# Patient Record
Sex: Female | Born: 1960
Health system: Southern US, Community
[De-identification: ages and names within clinical notes are randomized; demographics above are authoritative.]

## PROBLEM LIST (undated history)

## (undated) DIAGNOSIS — R51 Headache: Secondary | ICD-10-CM

## (undated) DIAGNOSIS — M542 Cervicalgia: Secondary | ICD-10-CM

## (undated) DIAGNOSIS — R519 Headache, unspecified: Secondary | ICD-10-CM

## (undated) DIAGNOSIS — M722 Plantar fascial fibromatosis: Secondary | ICD-10-CM

## (undated) HISTORY — DX: Headache, unspecified: R51.9

## (undated) HISTORY — DX: Cervicalgia: M54.2

## (undated) HISTORY — PX: BREAST BIOPSY: SHX20

## (undated) HISTORY — PX: BIOPSY BREAST: PRO8

## (undated) HISTORY — DX: Plantar fascial fibromatosis: M72.2

## (undated) HISTORY — DX: Headache: R51

---

## 1997-11-26 ENCOUNTER — Other Ambulatory Visit: Admission: RE | Admit: 1997-11-26 | Discharge: 1997-11-26 | Payer: Self-pay | Admitting: Obstetrics and Gynecology

## 1998-05-20 ENCOUNTER — Encounter: Payer: Self-pay | Admitting: Obstetrics & Gynecology

## 1998-05-20 ENCOUNTER — Ambulatory Visit (HOSPITAL_COMMUNITY): Admission: RE | Admit: 1998-05-20 | Discharge: 1998-05-20 | Payer: Self-pay | Admitting: Obstetrics & Gynecology

## 1998-06-18 ENCOUNTER — Inpatient Hospital Stay (HOSPITAL_COMMUNITY): Admission: AD | Admit: 1998-06-18 | Discharge: 1998-06-18 | Payer: Self-pay | Admitting: Obstetrics and Gynecology

## 1998-06-19 ENCOUNTER — Inpatient Hospital Stay (HOSPITAL_COMMUNITY): Admission: AD | Admit: 1998-06-19 | Discharge: 1998-06-22 | Payer: Self-pay | Admitting: Obstetrics and Gynecology

## 1999-11-18 ENCOUNTER — Encounter: Admission: RE | Admit: 1999-11-18 | Discharge: 1999-11-18 | Payer: Self-pay | Admitting: Obstetrics and Gynecology

## 1999-11-18 ENCOUNTER — Encounter: Payer: Self-pay | Admitting: Obstetrics and Gynecology

## 2000-11-05 ENCOUNTER — Ambulatory Visit (HOSPITAL_COMMUNITY): Admission: RE | Admit: 2000-11-05 | Discharge: 2000-11-05 | Payer: Self-pay | Admitting: Obstetrics and Gynecology

## 2000-11-05 ENCOUNTER — Encounter: Payer: Self-pay | Admitting: Obstetrics and Gynecology

## 2001-01-11 ENCOUNTER — Other Ambulatory Visit: Admission: RE | Admit: 2001-01-11 | Discharge: 2001-01-11 | Payer: Self-pay | Admitting: Obstetrics and Gynecology

## 2003-01-05 ENCOUNTER — Encounter: Payer: Self-pay | Admitting: Obstetrics and Gynecology

## 2003-01-05 ENCOUNTER — Encounter: Admission: RE | Admit: 2003-01-05 | Discharge: 2003-01-05 | Payer: Self-pay | Admitting: Obstetrics and Gynecology

## 2003-01-20 ENCOUNTER — Other Ambulatory Visit: Admission: RE | Admit: 2003-01-20 | Discharge: 2003-01-20 | Payer: Self-pay | Admitting: Obstetrics and Gynecology

## 2004-01-28 ENCOUNTER — Other Ambulatory Visit: Admission: RE | Admit: 2004-01-28 | Discharge: 2004-01-28 | Payer: Self-pay | Admitting: Obstetrics and Gynecology

## 2005-01-31 ENCOUNTER — Other Ambulatory Visit: Admission: RE | Admit: 2005-01-31 | Discharge: 2005-01-31 | Payer: Self-pay | Admitting: Obstetrics and Gynecology

## 2005-02-23 ENCOUNTER — Encounter: Admission: RE | Admit: 2005-02-23 | Discharge: 2005-02-23 | Payer: Self-pay | Admitting: Obstetrics and Gynecology

## 2006-02-01 ENCOUNTER — Other Ambulatory Visit: Admission: RE | Admit: 2006-02-01 | Discharge: 2006-02-01 | Payer: Self-pay | Admitting: Obstetrics and Gynecology

## 2006-03-30 ENCOUNTER — Encounter: Admission: RE | Admit: 2006-03-30 | Discharge: 2006-03-30 | Payer: Self-pay | Admitting: Family Medicine

## 2007-02-21 ENCOUNTER — Other Ambulatory Visit: Admission: RE | Admit: 2007-02-21 | Discharge: 2007-02-21 | Payer: Self-pay | Admitting: Obstetrics and Gynecology

## 2007-05-10 ENCOUNTER — Encounter: Admission: RE | Admit: 2007-05-10 | Discharge: 2007-05-10 | Payer: Self-pay | Admitting: Obstetrics and Gynecology

## 2008-02-24 ENCOUNTER — Other Ambulatory Visit: Admission: RE | Admit: 2008-02-24 | Discharge: 2008-02-24 | Payer: Self-pay | Admitting: Obstetrics and Gynecology

## 2008-03-03 ENCOUNTER — Encounter: Admission: RE | Admit: 2008-03-03 | Discharge: 2008-03-03 | Payer: Self-pay | Admitting: Obstetrics and Gynecology

## 2008-04-28 ENCOUNTER — Emergency Department (HOSPITAL_COMMUNITY): Admission: EM | Admit: 2008-04-28 | Discharge: 2008-04-28 | Payer: Self-pay | Admitting: Emergency Medicine

## 2008-05-15 ENCOUNTER — Encounter: Admission: RE | Admit: 2008-05-15 | Discharge: 2008-05-15 | Payer: Self-pay | Admitting: Obstetrics and Gynecology

## 2008-05-27 ENCOUNTER — Ambulatory Visit (HOSPITAL_COMMUNITY): Admission: RE | Admit: 2008-05-27 | Discharge: 2008-05-27 | Payer: Self-pay | Admitting: Obstetrics and Gynecology

## 2009-03-16 ENCOUNTER — Other Ambulatory Visit: Admission: RE | Admit: 2009-03-16 | Discharge: 2009-03-16 | Payer: Self-pay | Admitting: Obstetrics and Gynecology

## 2009-05-17 ENCOUNTER — Encounter: Admission: RE | Admit: 2009-05-17 | Discharge: 2009-05-17 | Payer: Self-pay | Admitting: Obstetrics and Gynecology

## 2010-05-02 ENCOUNTER — Other Ambulatory Visit: Admission: RE | Admit: 2010-05-02 | Discharge: 2010-05-02 | Payer: Self-pay | Admitting: Family Medicine

## 2010-05-20 ENCOUNTER — Encounter: Admission: RE | Admit: 2010-05-20 | Discharge: 2010-05-20 | Payer: Self-pay | Admitting: Family Medicine
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
## 2010-07-27 ENCOUNTER — Ambulatory Visit (HOSPITAL_COMMUNITY)
Admission: RE | Admit: 2010-07-27 | Discharge: 2010-07-27 | Payer: Self-pay | Source: Home / Self Care | Attending: Orthopedic Surgery | Admitting: Orthopedic Surgery

## 2010-08-14 ENCOUNTER — Encounter: Payer: Self-pay | Admitting: Family Medicine

## 2010-08-22 ENCOUNTER — Ambulatory Visit (HOSPITAL_COMMUNITY)
Admission: RE | Admit: 2010-08-22 | Discharge: 2010-08-22 | Payer: Self-pay | Source: Home / Self Care | Attending: Family Medicine | Admitting: Family Medicine

## 2011-04-25 ENCOUNTER — Other Ambulatory Visit: Payer: Self-pay | Admitting: Family Medicine

## 2011-04-25 DIAGNOSIS — Z1231 Encounter for screening mammogram for malignant neoplasm of breast: Secondary | ICD-10-CM

## 2011-05-22 ENCOUNTER — Ambulatory Visit
Admission: RE | Admit: 2011-05-22 | Discharge: 2011-05-22 | Disposition: A | Discharge status: Home / Self Care | Payer: 59 | Source: Ambulatory Visit | Attending: Family Medicine | Admitting: Family Medicine

## 2011-05-22 DIAGNOSIS — Z1231 Encounter for screening mammogram for malignant neoplasm of breast: Secondary | ICD-10-CM

## 2012-05-15 ENCOUNTER — Ambulatory Visit (HOSPITAL_COMMUNITY)
Admission: RE | Admit: 2012-05-15 | Discharge: 2012-05-15 | Disposition: A | Discharge status: Home / Self Care | Payer: 59 | Source: Ambulatory Visit | Attending: Family Medicine | Admitting: Family Medicine

## 2012-05-15 DIAGNOSIS — M79609 Pain in unspecified limb: Secondary | ICD-10-CM

## 2012-05-15 DIAGNOSIS — M7989 Other specified soft tissue disorders: Secondary | ICD-10-CM | POA: Insufficient documentation

## 2012-05-15 DIAGNOSIS — M79606 Pain in leg, unspecified: Secondary | ICD-10-CM

## 2012-05-15 NOTE — Progress Notes (Signed)
Left:  No evidence of DVT, superficial thrombosis, or Baker's cyst.  Right:  Negative for DVT in the common femoral vein.  

## 2012-05-21 ENCOUNTER — Other Ambulatory Visit (HOSPITAL_COMMUNITY): Payer: Self-pay | Admitting: Family Medicine

## 2012-05-21 DIAGNOSIS — Z780 Asymptomatic menopausal state: Secondary | ICD-10-CM

## 2012-05-23 ENCOUNTER — Other Ambulatory Visit: Payer: Self-pay | Admitting: Family Medicine

## 2012-05-23 DIAGNOSIS — Z1231 Encounter for screening mammogram for malignant neoplasm of breast: Secondary | ICD-10-CM

## 2012-05-28 ENCOUNTER — Ambulatory Visit
Admission: RE | Admit: 2012-05-28 | Discharge: 2012-05-28 | Disposition: A | Discharge status: Home / Self Care | Payer: 59 | Source: Ambulatory Visit | Attending: Family Medicine | Admitting: Family Medicine

## 2012-05-28 DIAGNOSIS — Z1231 Encounter for screening mammogram for malignant neoplasm of breast: Secondary | ICD-10-CM

## 2012-05-29 ENCOUNTER — Ambulatory Visit (HOSPITAL_COMMUNITY): Admission: RE | Admit: 2012-05-29 | Payer: 59 | Source: Ambulatory Visit

## 2012-06-03 ENCOUNTER — Ambulatory Visit (HOSPITAL_COMMUNITY)
Admission: RE | Admit: 2012-06-03 | Discharge: 2012-06-03 | Disposition: A | Discharge status: Home / Self Care | Payer: 59 | Source: Ambulatory Visit | Attending: Family Medicine | Admitting: Family Medicine

## 2012-06-03 DIAGNOSIS — Z780 Asymptomatic menopausal state: Secondary | ICD-10-CM | POA: Insufficient documentation

## 2012-06-03 DIAGNOSIS — Z13820 Encounter for screening for osteoporosis: Secondary | ICD-10-CM | POA: Insufficient documentation

## 2012-12-23 ENCOUNTER — Emergency Department (HOSPITAL_COMMUNITY)
Admission: EM | Admit: 2012-12-23 | Discharge: 2012-12-23 | Disposition: A | Discharge status: Home / Self Care | Payer: 59 | Attending: Emergency Medicine | Admitting: Emergency Medicine

## 2012-12-23 ENCOUNTER — Encounter (HOSPITAL_COMMUNITY): Payer: Self-pay | Admitting: Emergency Medicine

## 2012-12-23 DIAGNOSIS — Y9389 Activity, other specified: Secondary | ICD-10-CM | POA: Insufficient documentation

## 2012-12-23 DIAGNOSIS — Y92410 Unspecified street and highway as the place of occurrence of the external cause: Secondary | ICD-10-CM | POA: Insufficient documentation

## 2012-12-23 DIAGNOSIS — S0990XA Unspecified injury of head, initial encounter: Secondary | ICD-10-CM | POA: Insufficient documentation

## 2012-12-23 MED ORDER — METAXALONE 800 MG PO TABS: 800.0000 mg | ORAL_TABLET | Freq: Three times a day (TID) | ORAL | Status: DC

## 2012-12-23 MED ORDER — IBUPROFEN 600 MG PO TABS: 600.0000 mg | ORAL_TABLET | Freq: Four times a day (QID) | ORAL | Status: AC | PRN
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
## 2012-12-24 NOTE — ED Provider Notes (Signed)
Medical screening examination/treatment/procedure(s) were performed by non-physician practitioner and as supervising physician I was immediately available for consultation/collaboration.  Sophira Rumler L Modest Draeger, MD 12/24/12 1526 

## 2013-05-26 ENCOUNTER — Other Ambulatory Visit: Payer: Self-pay | Admitting: Family Medicine

## 2013-05-26 ENCOUNTER — Other Ambulatory Visit (HOSPITAL_COMMUNITY)
Admission: RE | Admit: 2013-05-26 | Discharge: 2013-05-26 | Disposition: A | Discharge status: Home / Self Care | Payer: 59 | Source: Ambulatory Visit | Attending: Family Medicine | Admitting: Family Medicine

## 2013-05-26 DIAGNOSIS — Z124 Encounter for screening for malignant neoplasm of cervix: Secondary | ICD-10-CM | POA: Insufficient documentation

## 2013-07-11 ENCOUNTER — Other Ambulatory Visit: Payer: Self-pay

## 2013-07-11 DIAGNOSIS — Z1231 Encounter for screening mammogram for malignant neoplasm of breast: Secondary | ICD-10-CM

## 2013-07-15 ENCOUNTER — Ambulatory Visit
Admission: RE | Admit: 2013-07-15 | Discharge: 2013-07-15 | Disposition: A | Discharge status: Home / Self Care | Payer: 59 | Source: Ambulatory Visit

## 2013-07-15 DIAGNOSIS — Z1231 Encounter for screening mammogram for malignant neoplasm of breast: Secondary | ICD-10-CM

## 2013-08-29 ENCOUNTER — Other Ambulatory Visit: Payer: Self-pay | Admitting: Gastroenterology

## 2013-09-18 ENCOUNTER — Encounter (HOSPITAL_COMMUNITY): Payer: 59

## 2013-09-18 ENCOUNTER — Other Ambulatory Visit (HOSPITAL_COMMUNITY): Payer: 59

## 2013-09-22 ENCOUNTER — Encounter (HOSPITAL_COMMUNITY): Payer: 59

## 2013-09-22 ENCOUNTER — Other Ambulatory Visit (HOSPITAL_COMMUNITY): Payer: 59

## 2013-09-25 ENCOUNTER — Other Ambulatory Visit (HOSPITAL_COMMUNITY): Payer: 59

## 2013-09-25 ENCOUNTER — Encounter (HOSPITAL_COMMUNITY): Payer: 59

## 2013-09-26 ENCOUNTER — Encounter (INDEPENDENT_AMBULATORY_CARE_PROVIDER_SITE_OTHER): Payer: Self-pay

## 2013-09-26 ENCOUNTER — Ambulatory Visit (HOSPITAL_COMMUNITY)
Admission: RE | Admit: 2013-09-26 | Discharge: 2013-09-26 | Disposition: A | Discharge status: Home / Self Care | Payer: 59 | Source: Ambulatory Visit | Attending: Vascular Surgery | Admitting: Vascular Surgery

## 2013-09-26 ENCOUNTER — Ambulatory Visit (HOSPITAL_COMMUNITY): Admission: RE | Admit: 2013-09-26 | Payer: 59 | Source: Ambulatory Visit

## 2013-09-26 ENCOUNTER — Other Ambulatory Visit (HOSPITAL_COMMUNITY): Payer: Self-pay | Admitting: Family Medicine

## 2013-09-26 DIAGNOSIS — R0989 Other specified symptoms and signs involving the circulatory and respiratory systems: Secondary | ICD-10-CM | POA: Insufficient documentation

## 2014-05-18 ENCOUNTER — Other Ambulatory Visit: Payer: Self-pay

## 2014-05-18 DIAGNOSIS — Z1239 Encounter for other screening for malignant neoplasm of breast: Secondary | ICD-10-CM

## 2014-07-01 ENCOUNTER — Other Ambulatory Visit: Payer: Self-pay | Admitting: Family Medicine

## 2014-07-01 ENCOUNTER — Other Ambulatory Visit (HOSPITAL_COMMUNITY)
Admission: RE | Admit: 2014-07-01 | Discharge: 2014-07-01 | Disposition: A | Discharge status: Home / Self Care | Payer: 59 | Source: Ambulatory Visit | Attending: Family Medicine | Admitting: Family Medicine

## 2014-07-01 DIAGNOSIS — Z01419 Encounter for gynecological examination (general) (routine) without abnormal findings: Secondary | ICD-10-CM | POA: Insufficient documentation

## 2014-07-03 LAB — CYTOLOGY - PAP

## 2014-07-21 ENCOUNTER — Ambulatory Visit
Admission: RE | Admit: 2014-07-21 | Discharge: 2014-07-21 | Disposition: A | Discharge status: Home / Self Care | Payer: 59 | Source: Ambulatory Visit

## 2014-07-21 ENCOUNTER — Other Ambulatory Visit: Payer: Self-pay

## 2014-07-21 DIAGNOSIS — Z1231 Encounter for screening mammogram for malignant neoplasm of breast: Secondary | ICD-10-CM

## 2015-01-26 ENCOUNTER — Ambulatory Visit (HOSPITAL_COMMUNITY)
Admission: RE | Admit: 2015-01-26 | Discharge: 2015-01-26 | Disposition: A | Discharge status: Home / Self Care | Payer: 59 | Source: Ambulatory Visit | Attending: Family Medicine | Admitting: Family Medicine

## 2015-01-26 ENCOUNTER — Other Ambulatory Visit (HOSPITAL_COMMUNITY): Payer: Self-pay | Admitting: Family Medicine

## 2015-01-26 DIAGNOSIS — M79609 Pain in unspecified limb: Secondary | ICD-10-CM | POA: Diagnosis not present

## 2015-01-26 DIAGNOSIS — M25561 Pain in right knee: Secondary | ICD-10-CM | POA: Insufficient documentation

## 2015-01-26 DIAGNOSIS — R52 Pain, unspecified: Secondary | ICD-10-CM

## 2015-01-26 NOTE — Progress Notes (Addendum)
Preliminary results by tech - Right Lower Ext. Venous Duplex Completed. Negative for deep and superficial vein thrombosis in the right lower extremity. A small  Baker's cyst  In the popliteal fossa  Is noted measuring approximately 3.1 x 2.8 x 1.2 cm. Dr. Michaelle CopasSmith's office was given results.  Marilynne Halstedita Deckard Stuber, BS, RDMS, RVT

## 2015-04-02 ENCOUNTER — Encounter: Payer: Self-pay | Admitting: Neurology

## 2015-04-02 ENCOUNTER — Ambulatory Visit (INDEPENDENT_AMBULATORY_CARE_PROVIDER_SITE_OTHER): Payer: 59 | Admitting: Neurology

## 2015-04-02 VITALS — BP 114/78 | HR 70 | Ht 66.0 in | Wt 196.0 lb

## 2015-04-02 DIAGNOSIS — G8929 Other chronic pain: Secondary | ICD-10-CM

## 2015-04-02 DIAGNOSIS — R51 Headache: Secondary | ICD-10-CM

## 2015-04-02 DIAGNOSIS — M542 Cervicalgia: Secondary | ICD-10-CM | POA: Diagnosis not present
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
## 2015-07-05 ENCOUNTER — Other Ambulatory Visit: Payer: Self-pay | Admitting: Family Medicine

## 2015-07-05 ENCOUNTER — Other Ambulatory Visit (HOSPITAL_COMMUNITY)
Admission: RE | Admit: 2015-07-05 | Discharge: 2015-07-05 | Disposition: A | Discharge status: Home / Self Care | Payer: 59 | Source: Ambulatory Visit | Attending: Family Medicine | Admitting: Family Medicine

## 2015-07-05 DIAGNOSIS — Z01411 Encounter for gynecological examination (general) (routine) with abnormal findings: Secondary | ICD-10-CM | POA: Insufficient documentation

## 2015-07-05 DIAGNOSIS — Z1151 Encounter for screening for human papillomavirus (HPV): Secondary | ICD-10-CM | POA: Insufficient documentation

## 2015-07-06 LAB — CYTOLOGY - PAP

## 2015-09-28 DIAGNOSIS — J209 Acute bronchitis, unspecified: Secondary | ICD-10-CM | POA: Diagnosis not present

## 2015-09-28 MED FILL — AMOXICILLIN 875 MG TABLET: 875 | 10 days supply | Qty: 20 | Fill #0

## 2015-09-28 MED FILL — BENZONATATE 200 MG CAPSULE: 200 | 10 days supply | Qty: 30 | Fill #0

## 2015-11-26 DIAGNOSIS — M25562 Pain in left knee: Secondary | ICD-10-CM | POA: Diagnosis not present

## 2015-12-13 DIAGNOSIS — S86912A Strain of unspecified muscle(s) and tendon(s) at lower leg level, left leg, initial encounter: Secondary | ICD-10-CM | POA: Diagnosis not present

## 2015-12-13 DIAGNOSIS — S8002XA Contusion of left knee, initial encounter: Secondary | ICD-10-CM | POA: Diagnosis not present

## 2015-12-21 DIAGNOSIS — Z0100 Encounter for examination of eyes and vision without abnormal findings: Secondary | ICD-10-CM | POA: Diagnosis not present

## 2015-12-27 DIAGNOSIS — M25562 Pain in left knee: Secondary | ICD-10-CM | POA: Diagnosis not present

## 2016-01-13 DIAGNOSIS — M222X2 Patellofemoral disorders, left knee: Secondary | ICD-10-CM | POA: Diagnosis not present

## 2016-01-13 DIAGNOSIS — M25562 Pain in left knee: Secondary | ICD-10-CM | POA: Diagnosis not present

## 2016-02-18 ENCOUNTER — Other Ambulatory Visit: Payer: Self-pay | Admitting: Family Medicine

## 2016-02-18 DIAGNOSIS — Z1231 Encounter for screening mammogram for malignant neoplasm of breast: Secondary | ICD-10-CM

## 2016-02-25 DIAGNOSIS — M2392 Unspecified internal derangement of left knee: Secondary | ICD-10-CM | POA: Diagnosis not present

## 2016-02-25 DIAGNOSIS — M25562 Pain in left knee: Secondary | ICD-10-CM | POA: Diagnosis not present

## 2016-02-25 DIAGNOSIS — M222X2 Patellofemoral disorders, left knee: Secondary | ICD-10-CM | POA: Diagnosis not present

## 2016-03-03 ENCOUNTER — Ambulatory Visit
Admission: RE | Admit: 2016-03-03 | Discharge: 2016-03-03 | Disposition: A | Discharge status: Home / Self Care | Payer: 59 | Source: Ambulatory Visit | Attending: Family Medicine | Admitting: Family Medicine

## 2016-03-03 DIAGNOSIS — Z1231 Encounter for screening mammogram for malignant neoplasm of breast: Secondary | ICD-10-CM

## 2016-08-02 DIAGNOSIS — Z0000 Encounter for general adult medical examination without abnormal findings: Secondary | ICD-10-CM | POA: Diagnosis not present

## 2016-08-02 DIAGNOSIS — Z13220 Encounter for screening for lipoid disorders: Secondary | ICD-10-CM | POA: Diagnosis not present

## 2017-02-28 DIAGNOSIS — H5203 Hypermetropia, bilateral: Secondary | ICD-10-CM | POA: Diagnosis not present

## 2017-07-18 ENCOUNTER — Other Ambulatory Visit: Payer: Self-pay | Admitting: Family Medicine

## 2017-07-18 DIAGNOSIS — Z1231 Encounter for screening mammogram for malignant neoplasm of breast: Secondary | ICD-10-CM

## 2017-07-23 ENCOUNTER — Ambulatory Visit
Admission: RE | Admit: 2017-07-23 | Discharge: 2017-07-23 | Disposition: A | Discharge status: Home / Self Care | Payer: 59 | Source: Ambulatory Visit | Attending: Family Medicine | Admitting: Family Medicine

## 2017-07-23 DIAGNOSIS — Z1231 Encounter for screening mammogram for malignant neoplasm of breast: Secondary | ICD-10-CM | POA: Diagnosis not present

## 2017-07-25 ENCOUNTER — Ambulatory Visit: Payer: 59

## 2017-09-28 MED FILL — CIPROFLOXACIN HCL 500 MG TA: 500 | 5 days supply | Qty: 10 | Fill #0

## 2017-12-07 MED FILL — NITROFURANTOIN MONO-MCR 100: 100 | 7 days supply | Qty: 14 | Fill #0

## 2018-04-11 MED FILL — SULFAMETHOXAZOLE-TMP DS TAB: 800-160 | 7 days supply | Qty: 14 | Fill #0

## 2018-07-18 ENCOUNTER — Other Ambulatory Visit: Payer: Self-pay | Admitting: Family Medicine

## 2018-07-18 DIAGNOSIS — Z1231 Encounter for screening mammogram for malignant neoplasm of breast: Secondary | ICD-10-CM

## 2018-08-08 ENCOUNTER — Ambulatory Visit: Payer: 59

## 2018-08-08 ENCOUNTER — Ambulatory Visit: Payer: Self-pay

## 2018-08-09 ENCOUNTER — Ambulatory Visit
Admission: RE | Admit: 2018-08-09 | Discharge: 2018-08-09 | Disposition: A | Discharge status: Home / Self Care | Payer: No Typology Code available for payment source | Source: Ambulatory Visit | Attending: Family Medicine | Admitting: Family Medicine

## 2018-08-09 DIAGNOSIS — Z1231 Encounter for screening mammogram for malignant neoplasm of breast: Secondary | ICD-10-CM

## 2018-08-12 ENCOUNTER — Ambulatory Visit: Payer: Self-pay

## 2018-10-07 MED FILL — NITROFURANTOIN MONO-MCR 100: 100 | 7 days supply | Qty: 14 | Fill #0

## 2019-06-05 MED FILL — SULFAMETHOXAZOLE-TMP DS TAB: 800-160 | 3 days supply | Qty: 6 | Fill #0

## 2020-04-08 ENCOUNTER — Other Ambulatory Visit: Payer: Self-pay | Admitting: Family Medicine

## 2020-04-08 DIAGNOSIS — Z1231 Encounter for screening mammogram for malignant neoplasm of breast: Secondary | ICD-10-CM

## 2020-04-23 ENCOUNTER — Ambulatory Visit
Admission: RE | Admit: 2020-04-23 | Discharge: 2020-04-23 | Disposition: A | Discharge status: Home / Self Care | Payer: No Typology Code available for payment source | Source: Ambulatory Visit | Attending: Family Medicine | Admitting: Family Medicine

## 2020-04-23 ENCOUNTER — Other Ambulatory Visit: Payer: Self-pay

## 2020-04-23 DIAGNOSIS — Z1231 Encounter for screening mammogram for malignant neoplasm of breast: Secondary | ICD-10-CM

## 2020-11-29 ENCOUNTER — Other Ambulatory Visit (HOSPITAL_COMMUNITY)
Admission: RE | Admit: 2020-11-29 | Discharge: 2020-11-29 | Disposition: A | Discharge status: Home / Self Care | Payer: No Typology Code available for payment source | Source: Ambulatory Visit | Attending: Family Medicine | Admitting: Family Medicine

## 2020-11-29 ENCOUNTER — Other Ambulatory Visit: Payer: Self-pay | Admitting: Family Medicine

## 2020-11-29 DIAGNOSIS — Z124 Encounter for screening for malignant neoplasm of cervix: Secondary | ICD-10-CM | POA: Insufficient documentation

## 2020-12-01 LAB — CYTOLOGY - PAP
Comment: NEGATIVE
Diagnosis: NEGATIVE
High risk HPV: NEGATIVE

## 2020-12-03 ENCOUNTER — Other Ambulatory Visit: Payer: Self-pay | Admitting: Family Medicine

## 2020-12-03 DIAGNOSIS — N6459 Other signs and symptoms in breast: Secondary | ICD-10-CM

## 2020-12-09 ENCOUNTER — Ambulatory Visit
Admission: RE | Admit: 2020-12-09 | Discharge: 2020-12-09 | Disposition: A | Discharge status: Home / Self Care | Payer: No Typology Code available for payment source | Source: Ambulatory Visit | Attending: Family Medicine | Admitting: Family Medicine

## 2020-12-09 ENCOUNTER — Other Ambulatory Visit: Payer: Self-pay

## 2020-12-09 DIAGNOSIS — N6459 Other signs and symptoms in breast: Secondary | ICD-10-CM

## 2020-12-27 ENCOUNTER — Other Ambulatory Visit: Payer: No Typology Code available for payment source

## 2021-01-10 ENCOUNTER — Other Ambulatory Visit: Payer: No Typology Code available for payment source

## 2021-05-23 ENCOUNTER — Other Ambulatory Visit: Payer: Self-pay | Admitting: Family Medicine

## 2021-05-23 DIAGNOSIS — Z1231 Encounter for screening mammogram for malignant neoplasm of breast: Secondary | ICD-10-CM

## 2021-05-26 ENCOUNTER — Ambulatory Visit
Admission: RE | Admit: 2021-05-26 | Discharge: 2021-05-26 | Disposition: A | Discharge status: Home / Self Care | Payer: No Typology Code available for payment source | Source: Ambulatory Visit | Attending: Family Medicine | Admitting: Family Medicine

## 2021-05-26 ENCOUNTER — Other Ambulatory Visit: Payer: Self-pay

## 2021-05-26 DIAGNOSIS — Z1231 Encounter for screening mammogram for malignant neoplasm of breast: Secondary | ICD-10-CM

## 2021-05-27 ENCOUNTER — Other Ambulatory Visit: Payer: Self-pay

## 2021-05-27 ENCOUNTER — Ambulatory Visit: Payer: No Typology Code available for payment source | Attending: Internal Medicine

## 2021-05-27 DIAGNOSIS — Z23 Encounter for immunization: Secondary | ICD-10-CM

## 2021-05-27 MED ORDER — PFIZER COVID-19 VAC BIVALENT 30 MCG/0.3ML IM SUSP
INTRAMUSCULAR | 0 refills | Status: AC
  Filled 2021-05-27: qty 0.3, 1d supply, fill #0

## 2021-05-27 NOTE — Progress Notes (Signed)
   Covid-19 Vaccination Clinic  Name:  FATEN FRIESON    MRN: [id number] DOB: [birth year]  05/27/2021  Ms. Pearman was observed post Covid-19 immunization for 15 minutes without incident. She was provided with Vaccine Information Sheet and instruction to access the V-Safe system.   Ms. Chou was instructed to call 911 with any severe reactions post vaccine: Difficulty breathing  Swelling of face and throat  A fast heartbeat  A bad rash all over body  Dizziness and weakness   Immunizations Administered     Name Date Dose VIS Date Route   Pfizer Covid-19 Vaccine Bivalent Booster 05/27/2021 12:05 PM 0.3 mL 03/23/2021 Intramuscular   Manufacturer: [employer], [employer]   Lot: [id number]   NDC: [address]-3      Drusilla Kanner, PharmD, MBA Clinical Acute Care Pharmacist

## 2021-05-31 ENCOUNTER — Ambulatory Visit: Payer: No Typology Code available for payment source

## 2021-08-19 ENCOUNTER — Ambulatory Visit
Admission: RE | Admit: 2021-08-19 | Discharge: 2021-08-19 | Disposition: A | Discharge status: Home / Self Care | Payer: No Typology Code available for payment source | Source: Ambulatory Visit | Attending: Family Medicine | Admitting: Family Medicine

## 2021-08-19 ENCOUNTER — Other Ambulatory Visit: Payer: Self-pay | Admitting: Family Medicine

## 2021-08-19 ENCOUNTER — Other Ambulatory Visit: Payer: Self-pay

## 2021-08-19 DIAGNOSIS — M79661 Pain in right lower leg: Secondary | ICD-10-CM

## 2021-08-27 ENCOUNTER — Other Ambulatory Visit (HOSPITAL_COMMUNITY): Payer: Self-pay

## 2021-08-27 MED ORDER — CARESTART COVID-19 HOME TEST VI KIT
PACK | 0 refills | Status: AC
  Filled 2021-08-27: qty 4, 4d supply, fill #0

## 2021-08-29 ENCOUNTER — Other Ambulatory Visit (HOSPITAL_COMMUNITY): Payer: Self-pay

## 2021-08-29 MED ORDER — PAXLOVID (300/100) 20 X 150 MG & 10 X 100MG PO TBPK
ORAL_TABLET | ORAL | 0 refills | Status: AC
  Filled 2021-08-29: qty 30, 5d supply, fill #0

## 2021-09-07 ENCOUNTER — Other Ambulatory Visit (HOSPITAL_COMMUNITY): Payer: Self-pay

## 2022-12-14 ENCOUNTER — Other Ambulatory Visit: Payer: Self-pay | Admitting: Family Medicine

## 2022-12-14 DIAGNOSIS — Z1231 Encounter for screening mammogram for malignant neoplasm of breast: Secondary | ICD-10-CM

## 2022-12-15 ENCOUNTER — Ambulatory Visit
Admission: RE | Admit: 2022-12-15 | Discharge: 2022-12-15 | Disposition: A | Discharge status: Home / Self Care | Payer: 59 | Source: Ambulatory Visit | Attending: Family Medicine | Admitting: Family Medicine

## 2022-12-15 DIAGNOSIS — Z1231 Encounter for screening mammogram for malignant neoplasm of breast: Secondary | ICD-10-CM

## 2022-12-25 ENCOUNTER — Other Ambulatory Visit (HOSPITAL_COMMUNITY): Payer: Self-pay

## 2022-12-25 DIAGNOSIS — M25561 Pain in right knee: Secondary | ICD-10-CM | POA: Diagnosis not present

## 2022-12-25 MED ORDER — IBUPROFEN 800 MG PO TABS
800.0000 mg | ORAL_TABLET | Freq: Three times a day (TID) | ORAL | 0 refills | Status: AC | PRN
  Filled 2022-12-25: qty 30, 10d supply, fill #0

## 2022-12-29 DIAGNOSIS — M25562 Pain in left knee: Secondary | ICD-10-CM | POA: Diagnosis not present

## 2022-12-29 DIAGNOSIS — M25561 Pain in right knee: Secondary | ICD-10-CM | POA: Diagnosis not present

## 2022-12-29 DIAGNOSIS — M1712 Unilateral primary osteoarthritis, left knee: Secondary | ICD-10-CM | POA: Diagnosis not present

## 2022-12-29 DIAGNOSIS — M170 Bilateral primary osteoarthritis of knee: Secondary | ICD-10-CM | POA: Diagnosis not present

## 2023-01-22 DIAGNOSIS — E7800 Pure hypercholesterolemia, unspecified: Secondary | ICD-10-CM | POA: Diagnosis not present

## 2023-01-22 DIAGNOSIS — Z0000 Encounter for general adult medical examination without abnormal findings: Secondary | ICD-10-CM | POA: Diagnosis not present

## 2023-01-22 DIAGNOSIS — M170 Bilateral primary osteoarthritis of knee: Secondary | ICD-10-CM | POA: Diagnosis not present

## 2023-02-06 DIAGNOSIS — M6281 Muscle weakness (generalized): Secondary | ICD-10-CM | POA: Diagnosis not present

## 2023-02-06 DIAGNOSIS — M1712 Unilateral primary osteoarthritis, left knee: Secondary | ICD-10-CM | POA: Diagnosis not present

## 2023-02-06 DIAGNOSIS — M1711 Unilateral primary osteoarthritis, right knee: Secondary | ICD-10-CM | POA: Diagnosis not present

## 2023-03-08 ENCOUNTER — Other Ambulatory Visit (HOSPITAL_COMMUNITY): Payer: Self-pay

## 2023-03-08 DIAGNOSIS — R051 Acute cough: Secondary | ICD-10-CM | POA: Diagnosis not present

## 2023-03-08 DIAGNOSIS — R5383 Other fatigue: Secondary | ICD-10-CM | POA: Diagnosis not present

## 2023-03-08 DIAGNOSIS — U071 COVID-19: Secondary | ICD-10-CM | POA: Diagnosis not present

## 2023-03-08 MED ORDER — PAXLOVID (300/100) 20 X 150 MG & 10 X 100MG PO TBPK
ORAL_TABLET | ORAL | 0 refills | Status: AC
  Filled 2023-03-08: qty 30, 5d supply, fill #0

## 2023-04-25 DIAGNOSIS — M170 Bilateral primary osteoarthritis of knee: Secondary | ICD-10-CM | POA: Diagnosis not present

## 2023-07-16 DIAGNOSIS — M25572 Pain in left ankle and joints of left foot: Secondary | ICD-10-CM | POA: Diagnosis not present

## 2024-02-18 DIAGNOSIS — Z0000 Encounter for general adult medical examination without abnormal findings: Secondary | ICD-10-CM | POA: Diagnosis not present

## 2024-02-18 DIAGNOSIS — E7800 Pure hypercholesterolemia, unspecified: Secondary | ICD-10-CM | POA: Diagnosis not present

## 2024-02-18 DIAGNOSIS — Z13820 Encounter for screening for osteoporosis: Secondary | ICD-10-CM | POA: Diagnosis not present

## 2024-02-18 DIAGNOSIS — Z1211 Encounter for screening for malignant neoplasm of colon: Secondary | ICD-10-CM | POA: Diagnosis not present

## 2024-02-19 ENCOUNTER — Other Ambulatory Visit: Payer: Self-pay | Admitting: Family Medicine

## 2024-02-19 DIAGNOSIS — Z13820 Encounter for screening for osteoporosis: Secondary | ICD-10-CM

## 2024-02-19 DIAGNOSIS — E2839 Other primary ovarian failure: Secondary | ICD-10-CM

## 2024-02-20 ENCOUNTER — Other Ambulatory Visit: Payer: Self-pay | Admitting: Family Medicine

## 2024-02-20 DIAGNOSIS — Z1231 Encounter for screening mammogram for malignant neoplasm of breast: Secondary | ICD-10-CM

## 2024-03-11 DIAGNOSIS — H524 Presbyopia: Secondary | ICD-10-CM | POA: Diagnosis not present

## 2024-03-20 ENCOUNTER — Ambulatory Visit
Admission: RE | Admit: 2024-03-20 | Discharge: 2024-03-20 | Disposition: A | Discharge status: Home / Self Care | Payer: Self-pay | Source: Ambulatory Visit | Attending: Family Medicine | Admitting: Family Medicine

## 2024-03-20 ENCOUNTER — Ambulatory Visit: Payer: Self-pay

## 2024-03-20 DIAGNOSIS — Z1231 Encounter for screening mammogram for malignant neoplasm of breast: Secondary | ICD-10-CM

## 2024-04-16 ENCOUNTER — Other Ambulatory Visit: Payer: Self-pay

## 2024-04-16 ENCOUNTER — Ambulatory Visit
Admission: RE | Admit: 2024-04-16 | Discharge: 2024-04-16 | Disposition: A | Discharge status: Home / Self Care | Payer: Self-pay | Source: Ambulatory Visit | Attending: Family Medicine | Admitting: Family Medicine

## 2024-04-16 DIAGNOSIS — E2839 Other primary ovarian failure: Secondary | ICD-10-CM | POA: Diagnosis not present

## 2024-04-16 DIAGNOSIS — Z780 Asymptomatic menopausal state: Secondary | ICD-10-CM | POA: Diagnosis not present

## 2024-04-16 DIAGNOSIS — Z13820 Encounter for screening for osteoporosis: Secondary | ICD-10-CM | POA: Diagnosis not present

## 2024-04-16 MED ORDER — PEG 3350-KCL-NA BICARB-NACL 420 G PO SOLR
4000.0000 mL | ORAL | 0 refills | Status: AC
  Filled 2024-04-16: qty 4000, 1d supply, fill #0

## 2024-04-16 MED ORDER — BISACODYL 5 MG PO TBEC
5.0000 mg | DELAYED_RELEASE_TABLET | ORAL | 0 refills | Status: AC
  Filled 2024-04-16: qty 4, 1d supply, fill #0

## 2024-04-21 DIAGNOSIS — Z1211 Encounter for screening for malignant neoplasm of colon: Secondary | ICD-10-CM | POA: Diagnosis not present

## 2024-04-21 DIAGNOSIS — D120 Benign neoplasm of cecum: Secondary | ICD-10-CM | POA: Diagnosis not present

## 2024-04-21 DIAGNOSIS — D123 Benign neoplasm of transverse colon: Secondary | ICD-10-CM | POA: Diagnosis not present

## 2024-04-21 DIAGNOSIS — K648 Other hemorrhoids: Secondary | ICD-10-CM | POA: Diagnosis not present
